# Patient Record
Sex: Male | Born: 1965
Health system: Southern US, Community
[De-identification: ages and names within clinical notes are randomized; demographics above are authoritative.]

## PROBLEM LIST (undated history)

## (undated) DIAGNOSIS — M1990 Unspecified osteoarthritis, unspecified site: Secondary | ICD-10-CM

## (undated) DIAGNOSIS — Z9889 Other specified postprocedural states: Secondary | ICD-10-CM

## (undated) DIAGNOSIS — Z87442 Personal history of urinary calculi: Secondary | ICD-10-CM

## (undated) DIAGNOSIS — E039 Hypothyroidism, unspecified: Secondary | ICD-10-CM

## (undated) DIAGNOSIS — R112 Nausea with vomiting, unspecified: Secondary | ICD-10-CM

## (undated) HISTORY — PX: TONSILLECTOMY: SUR1361

## (undated) HISTORY — PX: BACK SURGERY: SHX140

## (undated) HISTORY — PX: OTHER SURGICAL HISTORY: SHX169

---
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
## 1997-11-19 HISTORY — PX: MICRODISCECTOMY LUMBAR: SUR864

## 2009-11-19 HISTORY — PX: GASTRIC BYPASS: SHX52

## 2016-04-04 DIAGNOSIS — E039 Hypothyroidism, unspecified: Secondary | ICD-10-CM | POA: Diagnosis not present

## 2016-04-04 DIAGNOSIS — E559 Vitamin D deficiency, unspecified: Secondary | ICD-10-CM | POA: Diagnosis not present

## 2016-07-11 DIAGNOSIS — E039 Hypothyroidism, unspecified: Secondary | ICD-10-CM | POA: Diagnosis not present

## 2016-08-21 DIAGNOSIS — I480 Paroxysmal atrial fibrillation: Secondary | ICD-10-CM | POA: Diagnosis not present

## 2016-08-21 DIAGNOSIS — R5383 Other fatigue: Secondary | ICD-10-CM | POA: Diagnosis not present

## 2016-08-21 DIAGNOSIS — E039 Hypothyroidism, unspecified: Secondary | ICD-10-CM | POA: Diagnosis not present

## 2016-08-21 DIAGNOSIS — E559 Vitamin D deficiency, unspecified: Secondary | ICD-10-CM | POA: Diagnosis not present

## 2016-08-21 DIAGNOSIS — Z9884 Bariatric surgery status: Secondary | ICD-10-CM | POA: Diagnosis not present

## 2017-01-18 DIAGNOSIS — E291 Testicular hypofunction: Secondary | ICD-10-CM | POA: Diagnosis not present

## 2017-09-30 DIAGNOSIS — G5602 Carpal tunnel syndrome, left upper limb: Secondary | ICD-10-CM | POA: Diagnosis not present

## 2017-09-30 DIAGNOSIS — Z48811 Encounter for surgical aftercare following surgery on the nervous system: Secondary | ICD-10-CM | POA: Diagnosis not present

## 2017-10-09 DIAGNOSIS — E039 Hypothyroidism, unspecified: Secondary | ICD-10-CM | POA: Diagnosis not present

## 2017-10-09 DIAGNOSIS — E559 Vitamin D deficiency, unspecified: Secondary | ICD-10-CM | POA: Diagnosis not present

## 2017-10-09 DIAGNOSIS — R5383 Other fatigue: Secondary | ICD-10-CM | POA: Diagnosis not present

## 2017-10-09 DIAGNOSIS — Z125 Encounter for screening for malignant neoplasm of prostate: Secondary | ICD-10-CM | POA: Diagnosis not present

## 2017-10-09 DIAGNOSIS — E291 Testicular hypofunction: Secondary | ICD-10-CM | POA: Diagnosis not present

## 2017-10-09 DIAGNOSIS — Z9884 Bariatric surgery status: Secondary | ICD-10-CM | POA: Diagnosis not present

## 2017-10-09 DIAGNOSIS — I480 Paroxysmal atrial fibrillation: Secondary | ICD-10-CM | POA: Diagnosis not present

## 2017-10-25 DIAGNOSIS — G5602 Carpal tunnel syndrome, left upper limb: Secondary | ICD-10-CM | POA: Diagnosis not present

## 2017-10-25 DIAGNOSIS — Z9884 Bariatric surgery status: Secondary | ICD-10-CM | POA: Diagnosis not present

## 2017-10-25 DIAGNOSIS — I10 Essential (primary) hypertension: Secondary | ICD-10-CM | POA: Diagnosis not present

## 2018-03-17 DIAGNOSIS — J301 Allergic rhinitis due to pollen: Secondary | ICD-10-CM | POA: Diagnosis not present

## 2018-03-17 DIAGNOSIS — R05 Cough: Secondary | ICD-10-CM | POA: Diagnosis not present

## 2018-12-24 DIAGNOSIS — M4186 Other forms of scoliosis, lumbar region: Secondary | ICD-10-CM | POA: Diagnosis not present

## 2018-12-24 DIAGNOSIS — M47816 Spondylosis without myelopathy or radiculopathy, lumbar region: Secondary | ICD-10-CM | POA: Diagnosis not present

## 2018-12-24 DIAGNOSIS — E291 Testicular hypofunction: Secondary | ICD-10-CM | POA: Diagnosis not present

## 2018-12-24 DIAGNOSIS — E559 Vitamin D deficiency, unspecified: Secondary | ICD-10-CM | POA: Diagnosis not present

## 2018-12-24 DIAGNOSIS — E039 Hypothyroidism, unspecified: Secondary | ICD-10-CM | POA: Diagnosis not present

## 2018-12-24 DIAGNOSIS — M5136 Other intervertebral disc degeneration, lumbar region: Secondary | ICD-10-CM | POA: Diagnosis not present

## 2018-12-24 DIAGNOSIS — M549 Dorsalgia, unspecified: Secondary | ICD-10-CM | POA: Diagnosis not present

## 2018-12-24 DIAGNOSIS — M25551 Pain in right hip: Secondary | ICD-10-CM | POA: Diagnosis not present

## 2018-12-24 DIAGNOSIS — E785 Hyperlipidemia, unspecified: Secondary | ICD-10-CM | POA: Diagnosis not present

## 2018-12-24 DIAGNOSIS — Z79899 Other long term (current) drug therapy: Secondary | ICD-10-CM | POA: Diagnosis not present

## 2018-12-24 DIAGNOSIS — I480 Paroxysmal atrial fibrillation: Secondary | ICD-10-CM | POA: Diagnosis not present

## 2018-12-24 DIAGNOSIS — M48061 Spinal stenosis, lumbar region without neurogenic claudication: Secondary | ICD-10-CM | POA: Diagnosis not present

## 2018-12-24 DIAGNOSIS — M419 Scoliosis, unspecified: Secondary | ICD-10-CM | POA: Diagnosis not present

## 2018-12-24 DIAGNOSIS — R7301 Impaired fasting glucose: Secondary | ICD-10-CM | POA: Diagnosis not present

## 2018-12-30 DIAGNOSIS — I6521 Occlusion and stenosis of right carotid artery: Secondary | ICD-10-CM | POA: Diagnosis not present

## 2018-12-30 DIAGNOSIS — I6523 Occlusion and stenosis of bilateral carotid arteries: Secondary | ICD-10-CM | POA: Diagnosis not present

## 2019-02-02 DIAGNOSIS — M5136 Other intervertebral disc degeneration, lumbar region: Secondary | ICD-10-CM | POA: Diagnosis not present

## 2019-02-02 DIAGNOSIS — M545 Low back pain: Secondary | ICD-10-CM | POA: Diagnosis not present

## 2019-02-25 DIAGNOSIS — Z6838 Body mass index (BMI) 38.0-38.9, adult: Secondary | ICD-10-CM | POA: Diagnosis not present

## 2019-02-25 DIAGNOSIS — M5136 Other intervertebral disc degeneration, lumbar region: Secondary | ICD-10-CM | POA: Diagnosis not present

## 2019-04-22 DIAGNOSIS — Z01818 Encounter for other preprocedural examination: Secondary | ICD-10-CM | POA: Diagnosis not present

## 2019-05-08 DIAGNOSIS — K5750 Diverticulosis of both small and large intestine without perforation or abscess without bleeding: Secondary | ICD-10-CM | POA: Diagnosis not present

## 2019-05-08 DIAGNOSIS — Z1211 Encounter for screening for malignant neoplasm of colon: Secondary | ICD-10-CM | POA: Diagnosis not present

## 2019-05-08 DIAGNOSIS — Z7982 Long term (current) use of aspirin: Secondary | ICD-10-CM | POA: Diagnosis not present

## 2019-05-08 DIAGNOSIS — K648 Other hemorrhoids: Secondary | ICD-10-CM | POA: Diagnosis not present

## 2019-05-08 DIAGNOSIS — Z79899 Other long term (current) drug therapy: Secondary | ICD-10-CM | POA: Diagnosis not present

## 2019-05-08 DIAGNOSIS — Z9884 Bariatric surgery status: Secondary | ICD-10-CM | POA: Diagnosis not present

## 2019-05-08 DIAGNOSIS — K644 Residual hemorrhoidal skin tags: Secondary | ICD-10-CM | POA: Diagnosis not present

## 2019-05-08 DIAGNOSIS — K5730 Diverticulosis of large intestine without perforation or abscess without bleeding: Secondary | ICD-10-CM | POA: Diagnosis not present

## 2019-06-24 DIAGNOSIS — E039 Hypothyroidism, unspecified: Secondary | ICD-10-CM | POA: Diagnosis not present

## 2019-06-24 DIAGNOSIS — R7303 Prediabetes: Secondary | ICD-10-CM | POA: Diagnosis not present

## 2019-06-24 DIAGNOSIS — Z125 Encounter for screening for malignant neoplasm of prostate: Secondary | ICD-10-CM | POA: Diagnosis not present

## 2019-06-24 DIAGNOSIS — Z6835 Body mass index (BMI) 35.0-35.9, adult: Secondary | ICD-10-CM | POA: Diagnosis not present

## 2019-08-31 DIAGNOSIS — Z23 Encounter for immunization: Secondary | ICD-10-CM | POA: Diagnosis not present

## 2019-09-25 DIAGNOSIS — Z23 Encounter for immunization: Secondary | ICD-10-CM | POA: Diagnosis not present

## 2020-01-15 DIAGNOSIS — Z23 Encounter for immunization: Secondary | ICD-10-CM | POA: Diagnosis not present

## 2020-03-24 DIAGNOSIS — J301 Allergic rhinitis due to pollen: Secondary | ICD-10-CM | POA: Diagnosis not present

## 2020-07-11 DIAGNOSIS — I6521 Occlusion and stenosis of right carotid artery: Secondary | ICD-10-CM | POA: Diagnosis not present

## 2020-07-11 DIAGNOSIS — E785 Hyperlipidemia, unspecified: Secondary | ICD-10-CM | POA: Diagnosis not present

## 2020-07-11 DIAGNOSIS — E039 Hypothyroidism, unspecified: Secondary | ICD-10-CM | POA: Diagnosis not present

## 2020-07-11 DIAGNOSIS — Z125 Encounter for screening for malignant neoplasm of prostate: Secondary | ICD-10-CM | POA: Diagnosis not present

## 2020-07-11 DIAGNOSIS — Z8639 Personal history of other endocrine, nutritional and metabolic disease: Secondary | ICD-10-CM | POA: Diagnosis not present

## 2020-07-11 DIAGNOSIS — E559 Vitamin D deficiency, unspecified: Secondary | ICD-10-CM | POA: Diagnosis not present

## 2020-07-11 DIAGNOSIS — Z79899 Other long term (current) drug therapy: Secondary | ICD-10-CM | POA: Diagnosis not present

## 2020-07-11 DIAGNOSIS — E291 Testicular hypofunction: Secondary | ICD-10-CM | POA: Diagnosis not present

## 2020-08-06 DIAGNOSIS — Z23 Encounter for immunization: Secondary | ICD-10-CM | POA: Diagnosis not present

## 2020-09-05 DIAGNOSIS — N200 Calculus of kidney: Secondary | ICD-10-CM | POA: Diagnosis not present

## 2020-09-05 DIAGNOSIS — N132 Hydronephrosis with renal and ureteral calculous obstruction: Secondary | ICD-10-CM | POA: Diagnosis not present

## 2020-09-05 DIAGNOSIS — I700 Atherosclerosis of aorta: Secondary | ICD-10-CM | POA: Diagnosis not present

## 2020-09-05 DIAGNOSIS — K8080 Other cholelithiasis without obstruction: Secondary | ICD-10-CM | POA: Diagnosis not present

## 2020-09-05 DIAGNOSIS — N1330 Unspecified hydronephrosis: Secondary | ICD-10-CM | POA: Diagnosis not present

## 2020-09-05 DIAGNOSIS — R109 Unspecified abdominal pain: Secondary | ICD-10-CM | POA: Diagnosis not present

## 2020-09-05 DIAGNOSIS — K8020 Calculus of gallbladder without cholecystitis without obstruction: Secondary | ICD-10-CM | POA: Diagnosis not present

## 2020-09-05 DIAGNOSIS — R110 Nausea: Secondary | ICD-10-CM | POA: Diagnosis not present

## 2020-09-13 DIAGNOSIS — N202 Calculus of kidney with calculus of ureter: Secondary | ICD-10-CM | POA: Diagnosis not present

## 2020-09-19 ENCOUNTER — Other Ambulatory Visit: Payer: Self-pay | Admitting: Urology

## 2020-09-20 ENCOUNTER — Other Ambulatory Visit (HOSPITAL_COMMUNITY): Admission: RE | Admit: 2020-09-20 | Payer: Self-pay | Source: Ambulatory Visit

## 2020-09-20 ENCOUNTER — Other Ambulatory Visit: Payer: Self-pay | Admitting: Urology

## 2020-09-22 ENCOUNTER — Other Ambulatory Visit (HOSPITAL_COMMUNITY)
Admission: RE | Admit: 2020-09-22 | Discharge: 2020-09-22 | Disposition: A | Discharge status: Home / Self Care | Payer: BC Managed Care – PPO | Source: Ambulatory Visit | Attending: Urology | Admitting: Urology

## 2020-09-22 ENCOUNTER — Encounter (HOSPITAL_BASED_OUTPATIENT_CLINIC_OR_DEPARTMENT_OTHER): Payer: Self-pay | Admitting: Urology

## 2020-09-22 DIAGNOSIS — Z01812 Encounter for preprocedural laboratory examination: Secondary | ICD-10-CM | POA: Diagnosis not present

## 2020-09-22 DIAGNOSIS — Z20822 Contact with and (suspected) exposure to covid-19: Secondary | ICD-10-CM | POA: Diagnosis not present

## 2020-09-22 LAB — SARS CORONAVIRUS 2 (TAT 6-24 HRS): SARS Coronavirus 2: NEGATIVE

## 2020-09-22 NOTE — Progress Notes (Signed)
Patient to arrive at 0645 on 09/26/2020. History and medications reviewed. Pre-procedure instructions given. NPO after MN on Sunday except for clear liquids until 0445. Metoprolol and synthroid with sip of water morning of procedure. Driver secured.

## 2020-09-26 ENCOUNTER — Encounter (HOSPITAL_BASED_OUTPATIENT_CLINIC_OR_DEPARTMENT_OTHER)
Admission: RE | Disposition: A | Discharge status: Home / Self Care | Payer: Self-pay | Source: Home / Self Care | Attending: Urology

## 2020-09-26 ENCOUNTER — Ambulatory Visit (HOSPITAL_COMMUNITY): Payer: BC Managed Care – PPO

## 2020-09-26 ENCOUNTER — Other Ambulatory Visit: Payer: Self-pay

## 2020-09-26 ENCOUNTER — Encounter (HOSPITAL_BASED_OUTPATIENT_CLINIC_OR_DEPARTMENT_OTHER): Payer: Self-pay | Admitting: Urology

## 2020-09-26 ENCOUNTER — Ambulatory Visit (HOSPITAL_BASED_OUTPATIENT_CLINIC_OR_DEPARTMENT_OTHER)
Admission: RE | Admit: 2020-09-26 | Discharge: 2020-09-26 | Disposition: A | Discharge status: Home / Self Care | Payer: BC Managed Care – PPO | Attending: Urology | Admitting: Urology

## 2020-09-26 DIAGNOSIS — N201 Calculus of ureter: Secondary | ICD-10-CM | POA: Diagnosis not present

## 2020-09-26 DIAGNOSIS — Z87442 Personal history of urinary calculi: Secondary | ICD-10-CM | POA: Diagnosis not present

## 2020-09-26 DIAGNOSIS — Z01818 Encounter for other preprocedural examination: Secondary | ICD-10-CM | POA: Diagnosis not present

## 2020-09-26 DIAGNOSIS — M419 Scoliosis, unspecified: Secondary | ICD-10-CM | POA: Diagnosis not present

## 2020-09-26 DIAGNOSIS — N200 Calculus of kidney: Secondary | ICD-10-CM | POA: Diagnosis not present

## 2020-09-26 HISTORY — DX: Personal history of urinary calculi: Z87.442

## 2020-09-26 HISTORY — DX: Hypothyroidism, unspecified: E03.9

## 2020-09-26 HISTORY — PX: EXTRACORPOREAL SHOCK WAVE LITHOTRIPSY: SHX1557

## 2020-09-26 HISTORY — DX: Nausea with vomiting, unspecified: R11.2

## 2020-09-26 HISTORY — DX: Unspecified osteoarthritis, unspecified site: M19.90

## 2020-09-26 SURGERY — LITHOTRIPSY, ESWL
Anesthesia: LOCAL | Laterality: Left

## 2020-09-26 MED ORDER — DIPHENHYDRAMINE HCL 25 MG PO CAPS
25.0000 mg | ORAL_CAPSULE | ORAL | Status: AC
  Administered 2020-09-26: 25 mg via ORAL

## 2020-09-26 MED ORDER — CIPROFLOXACIN HCL 500 MG PO TABS
ORAL_TABLET | ORAL | Status: AC
  Filled 2020-09-26: qty 1

## 2020-09-26 MED ORDER — KETOROLAC TROMETHAMINE 30 MG/ML IJ SOLN
60.0000 mg | Freq: Once | INTRAMUSCULAR | Status: AC
  Administered 2020-09-26: 60 mg via INTRAMUSCULAR

## 2020-09-26 MED ORDER — SODIUM CHLORIDE 0.9 % IV SOLN: INTRAVENOUS | Status: DC

## 2020-09-26 MED ORDER — DIPHENHYDRAMINE HCL 25 MG PO CAPS
ORAL_CAPSULE | ORAL | Status: AC
  Filled 2020-09-26: qty 1

## 2020-09-26 MED ORDER — DIAZEPAM 5 MG PO TABS
10.0000 mg | ORAL_TABLET | ORAL | Status: AC
  Administered 2020-09-26: 10 mg via ORAL

## 2020-09-26 MED ORDER — KETOROLAC TROMETHAMINE 30 MG/ML IJ SOLN
INTRAMUSCULAR | Status: AC
  Filled 2020-09-26: qty 1

## 2020-09-26 MED ORDER — CIPROFLOXACIN HCL 500 MG PO TABS
500.0000 mg | ORAL_TABLET | ORAL | Status: AC
  Administered 2020-09-26: 500 mg via ORAL

## 2020-09-26 MED ORDER — DIAZEPAM 5 MG PO TABS
ORAL_TABLET | ORAL | Status: AC
  Filled 2020-09-26: qty 2

## 2020-09-26 NOTE — H&P (Signed)
H&P  Chief Complaint: Lt proximal ureteral stone  History of Present Illness: 54 yo male presents for ESL for mgmt of a Lt proximal ureteral stone.  Past Medical History:  Diagnosis Date  . Arthritis   . History of kidney stones   . Hypothyroidism   . PONV (postoperative nausea and vomiting)     Past Surgical History:  Procedure Laterality Date  . BACK SURGERY    . carpel tunnel Bilateral   . GASTRIC BYPASS  2011  . MICRODISCECTOMY LUMBAR  1999  . TONSILLECTOMY      Home Medications:    Allergies: No Known Allergies  History reviewed. No pertinent family history.  Social History:  reports that he has never smoked. He has never used smokeless tobacco. He reports that he does not use drugs. No history on file for alcohol use.  ROS: A complete review of systems was performed.  All systems are negative except for pertinent findings as noted.  Physical Exam:  Vital signs in last 24 hours: BP (!) 150/95   Pulse 68   Temp 98.5 F (36.9 C) (Oral)   Resp 17   Ht 6' (1.829 m)   Wt 119.9 kg   SpO2 98%   BMI 35.85 kg/m  Constitutional:  Alert and oriented, No acute distress Cardiovascular: Regular rate  Respiratory: Normal respiratory effort Neurologic: Grossly intact, no focal deficits Psychiatric: Normal mood and affect  Laboratory Data:  No results for input(s): WBC, HGB, HCT, PLT in the last 72 hours.  No results for input(s): NA, K, CL, GLUCOSE, BUN, CALCIUM, CREATININE in the last 72 hours.  Invalid input(s): CO3   No results found for this or any previous visit (from the past 24 hour(s)). Recent Results (from the past 240 hour(s))  SARS CORONAVIRUS 2 (TAT 6-24 HRS) Nasopharyngeal Nasopharyngeal Swab     Status: None   Collection Time: 09/22/20 12:53 PM   Specimen: Nasopharyngeal Swab  Result Value Ref Range Status   SARS Coronavirus 2 NEGATIVE NEGATIVE Final    Comment: (NOTE) SARS-CoV-2 target nucleic acids are NOT DETECTED.  The SARS-CoV-2 RNA is  generally detectable in upper and lower respiratory specimens during the acute phase of infection. Negative results do not preclude SARS-CoV-2 infection, do not rule out co-infections with other pathogens, and should not be used as the sole basis for treatment or other patient management decisions. Negative results must be combined with clinical observations, patient history, and epidemiological information. The expected result is Negative.  Fact Sheet for Patients: HairSlick.no  Fact Sheet for Healthcare Providers: quierodirigir.com  This test is not yet approved or cleared by the [place] FDA and  has been authorized for detection and/or diagnosis of SARS-CoV-2 by FDA under an Emergency Use Authorization (EUA). This EUA will remain  in effect (meaning this test can be used) for the duration of the COVID-19 declaration under Se ction 564(b)(1) of the Act, 21 U.S.C. section 360bbb-3(b)(1), unless the authorization is terminated or revoked sooner.  Performed at [facility], [address] [place], [place] [address]     Renal Function: No results for input(s): CREATININE in the last 168 hours. CrCl cannot be calculated (No successful lab value found.).  Radiologic Imaging: DG Abd 1 View  Result Date: 09/26/2020 CLINICAL DATA:  Preop for lithotripsy. EXAM: ABDOMEN - 1 VIEW COMPARISON:  CT scan 09/05/2020 FINDINGS: The left ureteral calculus has moved down into the pelvis and is just above the level of the left acetabulum. Stable  lower pole left renal calculus. The bowel gas pattern is unremarkable. Stable scoliosis and degenerative changes involving the spine. IMPRESSION: The left ureteral calculus has moved down into the pelvis and is just above the level of the left acetabulum. Electronically Signed   By: Rudie Meyer M.D.   On: 09/26/2020 07:34    Impression/Assessment:  Lt proximal ureteral stone  Plan:   ESL--pt is aware that this is the 1st stage of a possible staged procedure.

## 2020-09-26 NOTE — Discharge Instructions (Signed)
Lithotripsy, Care After This sheet gives you information about how to care for yourself after your procedure. Your health care provider may also give you more specific instructions. If you have problems or questions, contact your health care provider. What can I expect after the procedure? After the procedure, it is common to have:  Some blood in your urine. This should only last for a few days.  Soreness in your back, sides, or upper abdomen for a few days.  Blotches or bruises on your back where the pressure wave entered the skin.  Pain, discomfort, or nausea when pieces (fragments) of the kidney stone move through the tube that carries urine from the kidney to the bladder (ureter). Stone fragments may pass soon after the procedure, but they may continue to pass for up to 4-8 weeks. ? If you have severe pain or nausea, contact your health care provider. This may be caused by a large stone that was not broken up, and this may mean that you need more treatment.  Some pain or discomfort during urination.  Some pain or discomfort in the lower abdomen or (in men) at the base of the penis. Follow these instructions at home: Medicines  Take over-the-counter and prescription medicines only as told by your health care provider.  If you were prescribed an antibiotic medicine, take it as told by your health care provider. Do not stop taking the antibiotic even if you start to feel better.  Do not drive for 24 hours if you were given a medicine to help you relax (sedative).  Do not drive or use heavy machinery while taking prescription pain medicine. Eating and drinking      Drink enough water and fluids to keep your urine clear or pale yellow. This helps any remaining pieces of the stone to pass. It can also help prevent new stones from forming.  Eat plenty of fresh fruits and vegetables.  Follow instructions from your health care provider about eating and drinking restrictions. You may be  instructed: ? To reduce how much salt (sodium) you eat or drink. Check ingredients and nutrition facts on packaged foods and beverages. ? To reduce how much meat you eat.  Eat the recommended amount of calcium for your age and gender. Ask your health care provider how much calcium you should have. General instructions  Get plenty of rest.  Most people can resume normal activities 1-2 days after the procedure. Ask your health care provider what activities are safe for you.  Your health care provider may direct you to lie in a certain position (postural drainage) and tap firmly (percuss) over your kidney area to help stone fragments pass. Follow instructions as told by your health care provider.  If directed, strain all urine through the strainer that was provided by your health care provider. ? Keep all fragments for your health care provider to see. Any stones that are found may be sent to a medical lab for examination. The stone may be as small as a grain of salt.  Keep all follow-up visits as told by your health care provider. This is important. Contact a health care provider if:  You have pain that is severe or does not get better with medicine.  You have nausea that is severe or does not go away.  You have blood in your urine longer than your health care provider told you to expect.  You have more blood in your urine.  You have pain during urination that does   not go away.  You urinate more frequently than usual and this does not go away.  You develop a rash or any other possible signs of an allergic reaction. Get help right away if:  You have severe pain in your back, sides, or upper abdomen.  You have severe pain while urinating.  Your urine is very dark red.  You have blood in your stool (feces).  You cannot pass any urine at all.  You feel a strong urge to urinate after emptying your bladder.  You have a fever or chills.  You develop shortness of breath,  difficulty breathing, or chest pain.  You have severe nausea that leads to persistent vomiting.  You faint. Summary  After this procedure, it is common to have some pain, discomfort, or nausea when pieces (fragments) of the kidney stone move through the tube that carries urine from the kidney to the bladder (ureter). If this pain or nausea is severe, however, you should contact your health care provider.  Most people can resume normal activities 1-2 days after the procedure. Ask your health care provider what activities are safe for you.  Drink enough water and fluids to keep your urine clear or pale yellow. This helps any remaining pieces of the stone to pass, and it can help prevent new stones from forming.  If directed, strain your urine and keep all fragments for your health care provider to see. Fragments or stones may be as small as a grain of salt.  Get help right away if you have severe pain in your back, sides, or upper abdomen or have severe pain while urinating. This information is not intended to replace advice given to you by your health care provider. Make sure you discuss any questions you have with your health care provider. Document Revised: 02/16/2019 Document Reviewed: 09/26/2016 Elsevier Patient Education  2020 [employer] See [facility] discharge instructions in chart. 

## 2020-09-26 NOTE — Interval H&P Note (Signed)
History and Physical Interval Note:  09/26/2020 10:18 AM  Jenean Lindau  has presented today for surgery, with the diagnosis of LEFT URETERAL STONE.  The various methods of treatment have been discussed with the patient and family. After consideration of risks, benefits and other options for treatment, the patient has consented to  Procedure(s): EXTRACORPOREAL SHOCK WAVE LITHOTRIPSY (ESWL) (Left) as a surgical intervention.  The patient's history has been reviewed, patient examined, no change in status, stable for surgery.  I have reviewed the patient's chart and labs.  Questions were answered to the patient's satisfaction.     Bertram Millard Kalene Cutler

## 2020-09-26 NOTE — Op Note (Signed)
See [facility] OP note scanned into chart. 

## 2020-09-27 ENCOUNTER — Encounter (HOSPITAL_BASED_OUTPATIENT_CLINIC_OR_DEPARTMENT_OTHER): Payer: Self-pay | Admitting: Urology

## 2020-10-03 DIAGNOSIS — Z23 Encounter for immunization: Secondary | ICD-10-CM | POA: Diagnosis not present

## 2020-10-18 DIAGNOSIS — N202 Calculus of kidney with calculus of ureter: Secondary | ICD-10-CM | POA: Diagnosis not present

## 2020-10-18 DIAGNOSIS — N201 Calculus of ureter: Secondary | ICD-10-CM | POA: Diagnosis not present

## 2021-08-31 DIAGNOSIS — M48061 Spinal stenosis, lumbar region without neurogenic claudication: Secondary | ICD-10-CM | POA: Diagnosis not present

## 2021-08-31 DIAGNOSIS — M545 Low back pain, unspecified: Secondary | ICD-10-CM | POA: Diagnosis not present

## 2021-09-06 DIAGNOSIS — M545 Low back pain, unspecified: Secondary | ICD-10-CM | POA: Diagnosis not present

## 2021-09-06 DIAGNOSIS — Z6838 Body mass index (BMI) 38.0-38.9, adult: Secondary | ICD-10-CM | POA: Diagnosis not present

## 2021-09-06 DIAGNOSIS — G8929 Other chronic pain: Secondary | ICD-10-CM | POA: Diagnosis not present

## 2021-09-06 DIAGNOSIS — I10 Essential (primary) hypertension: Secondary | ICD-10-CM | POA: Diagnosis not present

## 2021-09-19 DIAGNOSIS — R2689 Other abnormalities of gait and mobility: Secondary | ICD-10-CM | POA: Diagnosis not present

## 2021-09-19 DIAGNOSIS — M546 Pain in thoracic spine: Secondary | ICD-10-CM | POA: Diagnosis not present

## 2021-09-21 DIAGNOSIS — M546 Pain in thoracic spine: Secondary | ICD-10-CM | POA: Diagnosis not present

## 2021-09-21 DIAGNOSIS — R2689 Other abnormalities of gait and mobility: Secondary | ICD-10-CM | POA: Diagnosis not present

## 2021-09-21 DIAGNOSIS — M5489 Other dorsalgia: Secondary | ICD-10-CM | POA: Diagnosis not present

## 2021-09-22 DIAGNOSIS — R2689 Other abnormalities of gait and mobility: Secondary | ICD-10-CM | POA: Diagnosis not present

## 2021-09-22 DIAGNOSIS — M546 Pain in thoracic spine: Secondary | ICD-10-CM | POA: Diagnosis not present

## 2021-09-22 DIAGNOSIS — M5489 Other dorsalgia: Secondary | ICD-10-CM | POA: Diagnosis not present

## 2021-09-23 DIAGNOSIS — R2689 Other abnormalities of gait and mobility: Secondary | ICD-10-CM | POA: Diagnosis not present

## 2021-09-23 DIAGNOSIS — M546 Pain in thoracic spine: Secondary | ICD-10-CM | POA: Diagnosis not present

## 2021-09-23 DIAGNOSIS — M5489 Other dorsalgia: Secondary | ICD-10-CM | POA: Diagnosis not present

## 2021-10-21 DIAGNOSIS — M546 Pain in thoracic spine: Secondary | ICD-10-CM | POA: Diagnosis not present

## 2021-10-21 DIAGNOSIS — M5489 Other dorsalgia: Secondary | ICD-10-CM | POA: Diagnosis not present

## 2021-10-21 DIAGNOSIS — R2689 Other abnormalities of gait and mobility: Secondary | ICD-10-CM | POA: Diagnosis not present

## 2021-11-02 DIAGNOSIS — Z23 Encounter for immunization: Secondary | ICD-10-CM | POA: Diagnosis not present

## 2021-11-21 DIAGNOSIS — M5489 Other dorsalgia: Secondary | ICD-10-CM | POA: Diagnosis not present

## 2021-11-21 DIAGNOSIS — R2689 Other abnormalities of gait and mobility: Secondary | ICD-10-CM | POA: Diagnosis not present

## 2021-11-21 DIAGNOSIS — M546 Pain in thoracic spine: Secondary | ICD-10-CM | POA: Diagnosis not present

## 2021-11-27 DIAGNOSIS — M546 Pain in thoracic spine: Secondary | ICD-10-CM | POA: Diagnosis not present

## 2021-11-27 DIAGNOSIS — R2689 Other abnormalities of gait and mobility: Secondary | ICD-10-CM | POA: Diagnosis not present

## 2021-12-11 DIAGNOSIS — M546 Pain in thoracic spine: Secondary | ICD-10-CM | POA: Diagnosis not present

## 2021-12-11 DIAGNOSIS — R2689 Other abnormalities of gait and mobility: Secondary | ICD-10-CM | POA: Diagnosis not present

## 2021-12-22 DIAGNOSIS — M5489 Other dorsalgia: Secondary | ICD-10-CM | POA: Diagnosis not present

## 2021-12-22 DIAGNOSIS — M546 Pain in thoracic spine: Secondary | ICD-10-CM | POA: Diagnosis not present

## 2021-12-22 DIAGNOSIS — R2689 Other abnormalities of gait and mobility: Secondary | ICD-10-CM | POA: Diagnosis not present

## 2022-01-08 DIAGNOSIS — E291 Testicular hypofunction: Secondary | ICD-10-CM | POA: Diagnosis not present

## 2022-01-08 DIAGNOSIS — E039 Hypothyroidism, unspecified: Secondary | ICD-10-CM | POA: Diagnosis not present

## 2022-01-08 DIAGNOSIS — E559 Vitamin D deficiency, unspecified: Secondary | ICD-10-CM | POA: Diagnosis not present

## 2022-01-08 DIAGNOSIS — M5136 Other intervertebral disc degeneration, lumbar region: Secondary | ICD-10-CM | POA: Diagnosis not present

## 2022-01-08 DIAGNOSIS — E785 Hyperlipidemia, unspecified: Secondary | ICD-10-CM | POA: Diagnosis not present

## 2022-01-08 DIAGNOSIS — Z8639 Personal history of other endocrine, nutritional and metabolic disease: Secondary | ICD-10-CM | POA: Diagnosis not present

## 2022-01-08 DIAGNOSIS — Z125 Encounter for screening for malignant neoplasm of prostate: Secondary | ICD-10-CM | POA: Diagnosis not present

## 2022-01-19 DIAGNOSIS — R2689 Other abnormalities of gait and mobility: Secondary | ICD-10-CM | POA: Diagnosis not present

## 2022-01-19 DIAGNOSIS — M5489 Other dorsalgia: Secondary | ICD-10-CM | POA: Diagnosis not present

## 2022-01-19 DIAGNOSIS — M546 Pain in thoracic spine: Secondary | ICD-10-CM | POA: Diagnosis not present

## 2022-02-19 DIAGNOSIS — M5489 Other dorsalgia: Secondary | ICD-10-CM | POA: Diagnosis not present

## 2022-02-19 DIAGNOSIS — R2689 Other abnormalities of gait and mobility: Secondary | ICD-10-CM | POA: Diagnosis not present

## 2022-02-19 DIAGNOSIS — M546 Pain in thoracic spine: Secondary | ICD-10-CM | POA: Diagnosis not present

## 2022-02-27 DIAGNOSIS — G4733 Obstructive sleep apnea (adult) (pediatric): Secondary | ICD-10-CM | POA: Diagnosis not present

## 2022-03-19 DIAGNOSIS — G4733 Obstructive sleep apnea (adult) (pediatric): Secondary | ICD-10-CM | POA: Diagnosis not present

## 2022-03-21 DIAGNOSIS — R2689 Other abnormalities of gait and mobility: Secondary | ICD-10-CM | POA: Diagnosis not present

## 2022-03-21 DIAGNOSIS — M546 Pain in thoracic spine: Secondary | ICD-10-CM | POA: Diagnosis not present

## 2022-03-21 DIAGNOSIS — M5489 Other dorsalgia: Secondary | ICD-10-CM | POA: Diagnosis not present

## 2022-04-09 IMAGING — DX DG ABDOMEN 1V
2 series · 2 of 2 positions shown · non-contrast
Comparison: CT scan 09/05/2020

CLINICAL DATA: Preop for lithotripsy.

EXAM:
ABDOMEN - 1 VIEW

[abdomen kub (1 of 2)]
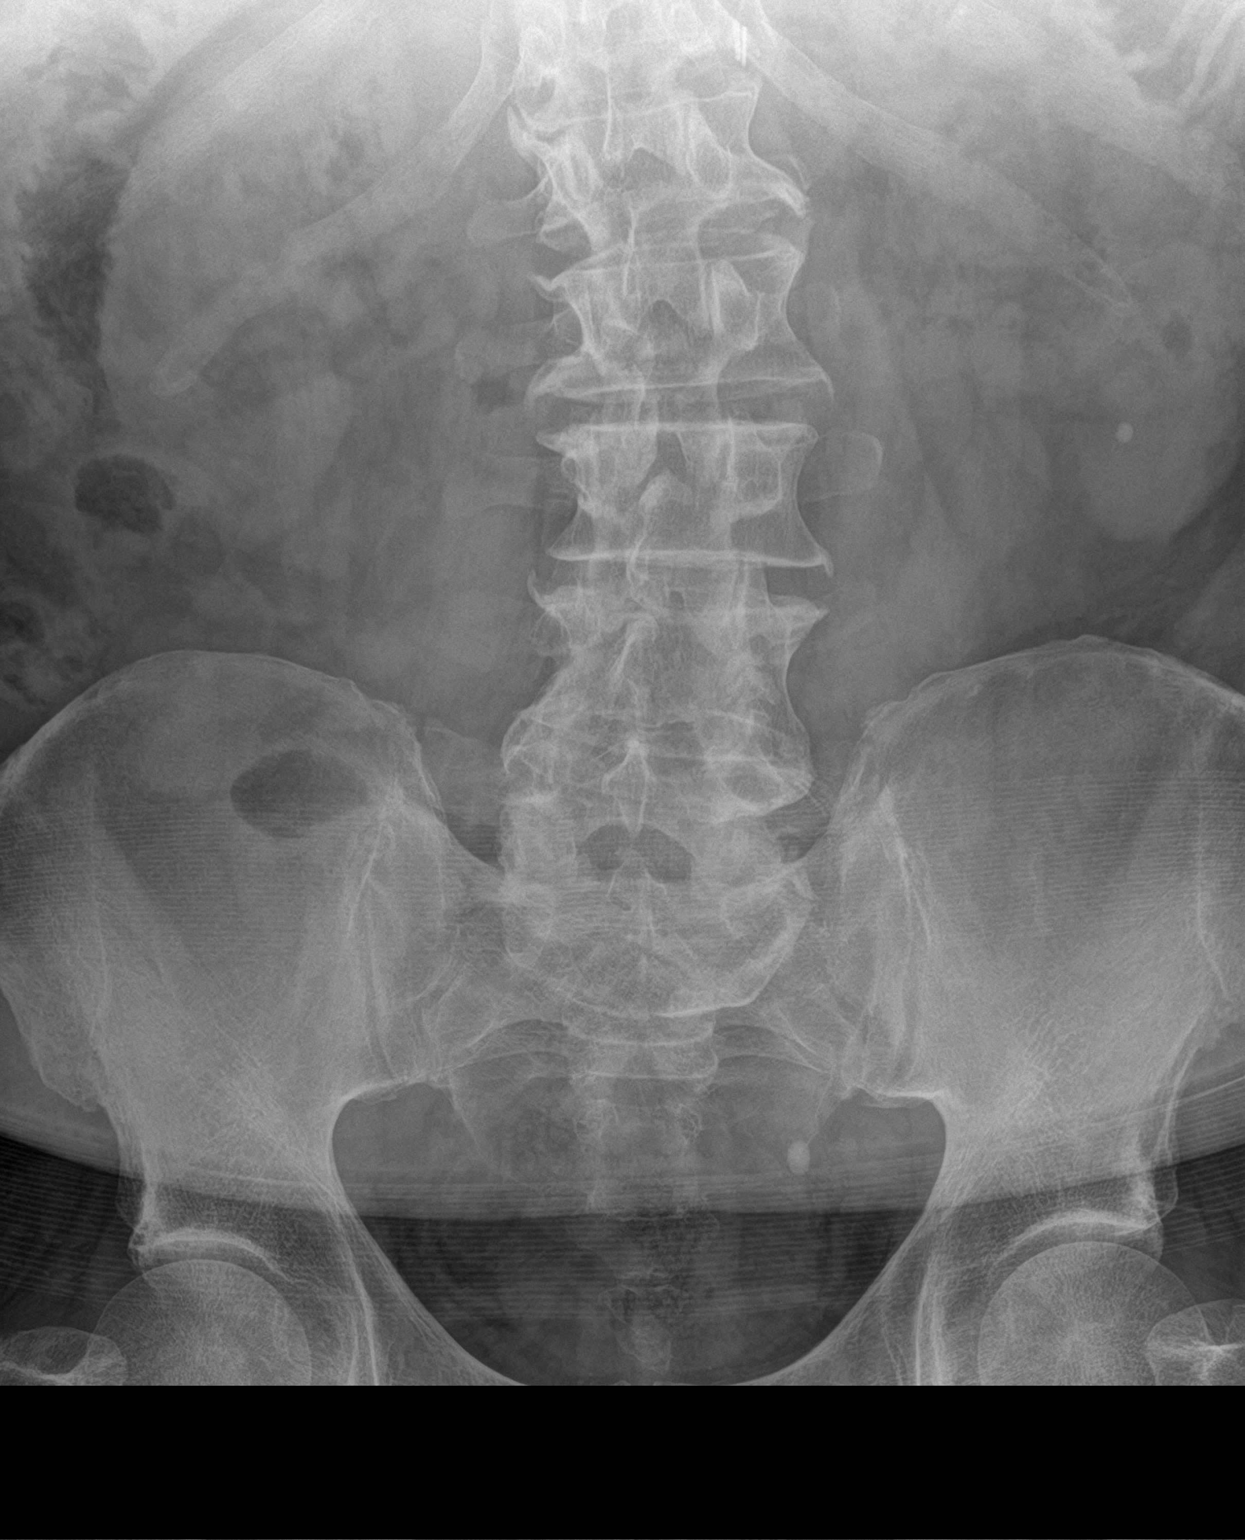

[abdomen kub (2 of 2)]
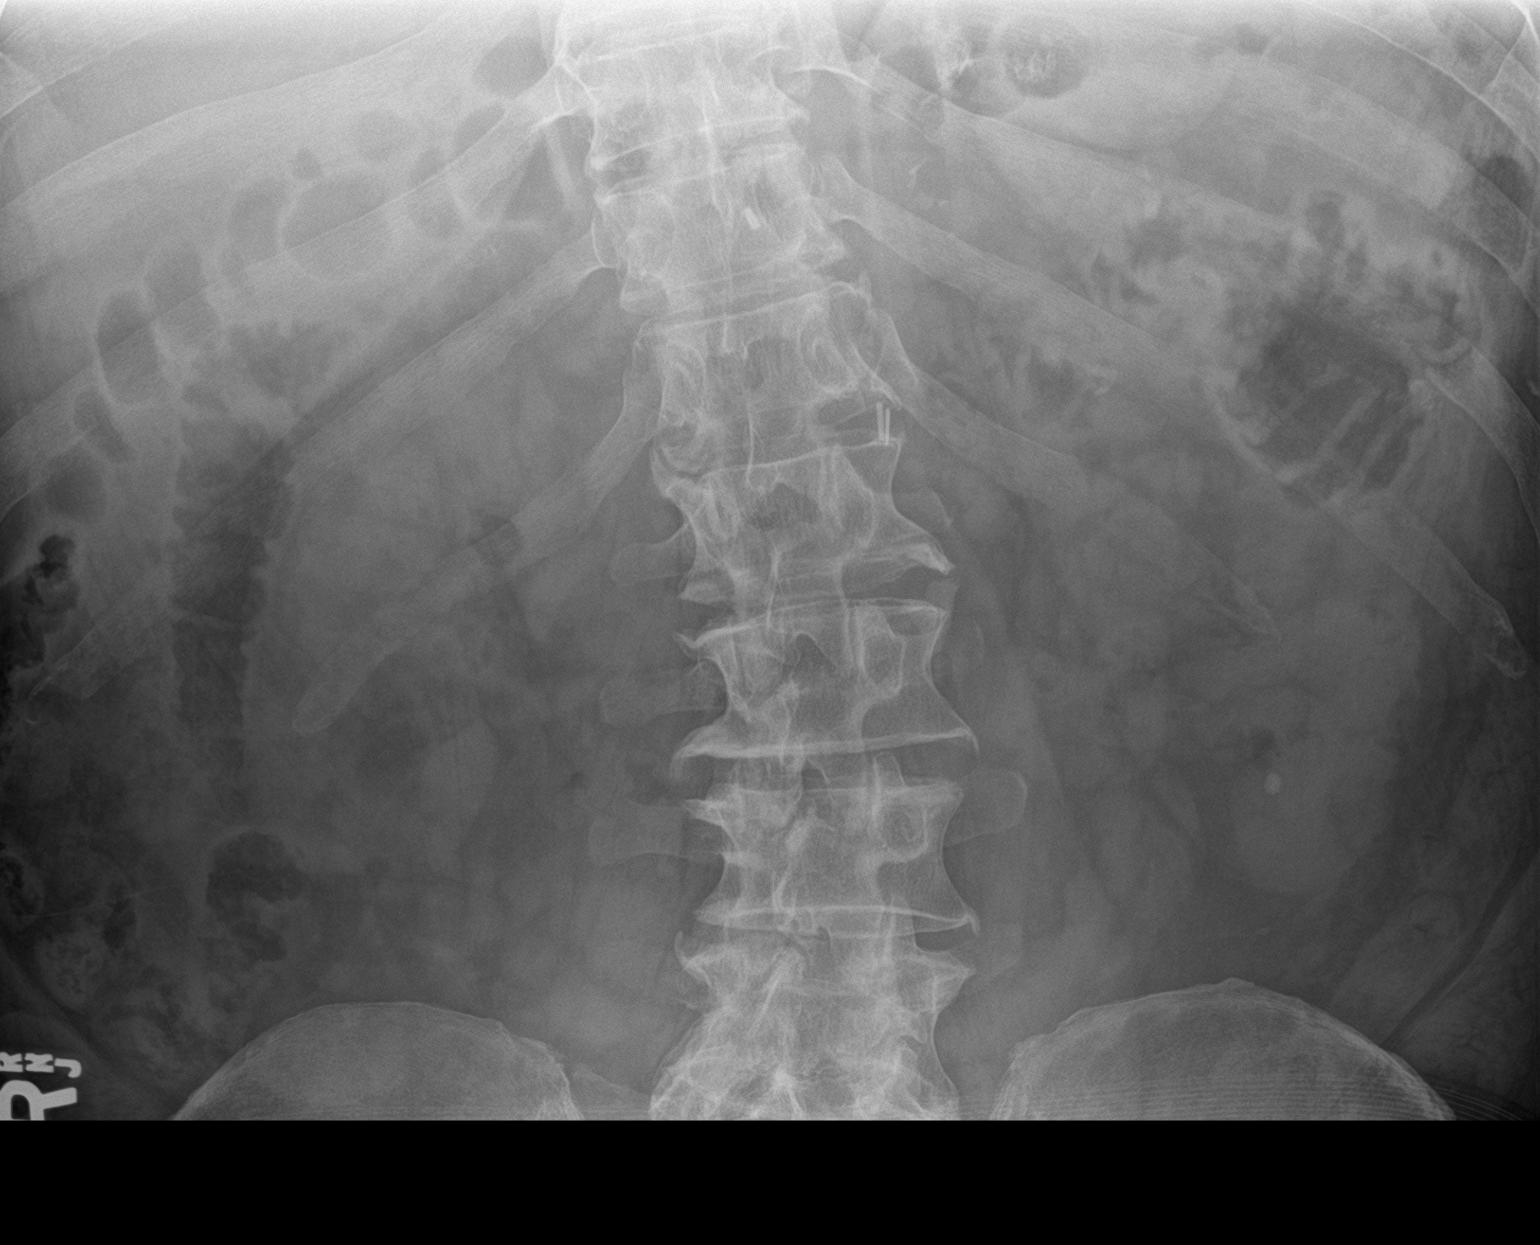

[2 of 2 positions shown; findings below may reference images not displayed]

FINDINGS: The left ureteral calculus has moved down into the pelvis and is
just above the level of the left acetabulum.

Stable lower pole left renal calculus.

The bowel gas pattern is unremarkable. Stable scoliosis and
degenerative changes involving the spine.
IMPRESSION: The left ureteral calculus has moved down into the pelvis and is
just above the level of the left acetabulum.

## 2022-05-21 DIAGNOSIS — R2689 Other abnormalities of gait and mobility: Secondary | ICD-10-CM | POA: Diagnosis not present

## 2022-05-21 DIAGNOSIS — M546 Pain in thoracic spine: Secondary | ICD-10-CM | POA: Diagnosis not present

## 2022-05-21 DIAGNOSIS — M5489 Other dorsalgia: Secondary | ICD-10-CM | POA: Diagnosis not present
